# Patient Record
Sex: Female | Born: 1980 | Hispanic: Yes | Marital: Married | Smoking: Former smoker
Health system: Southern US, Community
[De-identification: ages and names within clinical notes are randomized; demographics above are authoritative.]

## PROBLEM LIST (undated history)

## (undated) DIAGNOSIS — E119 Type 2 diabetes mellitus without complications: Secondary | ICD-10-CM

---

## 2017-10-25 ENCOUNTER — Other Ambulatory Visit: Payer: Self-pay | Admitting: Family Medicine

## 2017-10-25 DIAGNOSIS — M25561 Pain in right knee: Principal | ICD-10-CM

## 2017-10-25 DIAGNOSIS — G8929 Other chronic pain: Secondary | ICD-10-CM

## 2017-11-04 ENCOUNTER — Ambulatory Visit
Admission: RE | Admit: 2017-11-04 | Discharge: 2017-11-04 | Disposition: A | Discharge status: Home / Self Care | Payer: BLUE CROSS/BLUE SHIELD | Source: Ambulatory Visit | Attending: Family Medicine | Admitting: Family Medicine

## 2017-11-04 DIAGNOSIS — S83011A Lateral subluxation of right patella, initial encounter: Secondary | ICD-10-CM | POA: Insufficient documentation

## 2017-11-04 DIAGNOSIS — M25561 Pain in right knee: Secondary | ICD-10-CM | POA: Diagnosis present

## 2017-11-04 DIAGNOSIS — X58XXXA Exposure to other specified factors, initial encounter: Secondary | ICD-10-CM | POA: Diagnosis not present

## 2017-11-04 DIAGNOSIS — M25461 Effusion, right knee: Secondary | ICD-10-CM | POA: Insufficient documentation

## 2017-11-04 DIAGNOSIS — R609 Edema, unspecified: Secondary | ICD-10-CM | POA: Diagnosis not present

## 2017-11-04 DIAGNOSIS — G8929 Other chronic pain: Secondary | ICD-10-CM

## 2019-03-24 IMAGING — MR MR KNEE*R* W/O CM
6 series · 37 of 40 positions shown · non-contrast
Comparison: None.

CLINICAL DATA: Right knee pain for the past 8 months after twisting
injury.

EXAM:
MRI OF THE RIGHT KNEE WITHOUT CONTRAST
TECHNIQUE: Multiplanar, multisequence MR imaging of the knee was performed. No
intravenous contrast was administered.

[Series 3: PD fat-sat · axial · 3.0mm · 0.50mm/px · z∈[-74,+50]mm · 8 of 39 slices shown (1 of 4)]
[im 1/39]
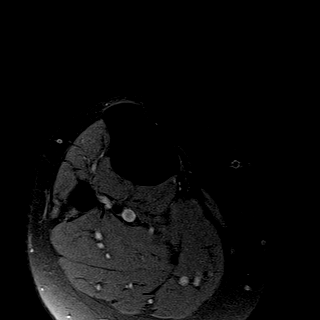
[im 6/39]
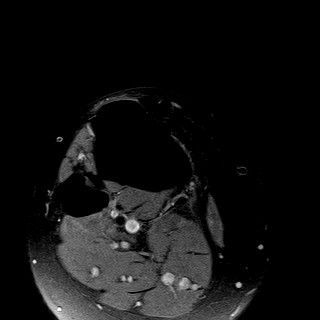
[im 11/39]
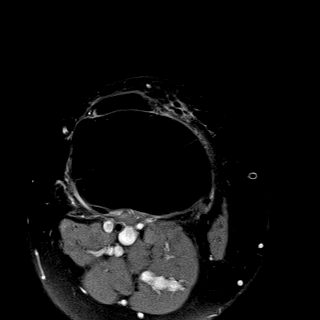
[im 17/39]
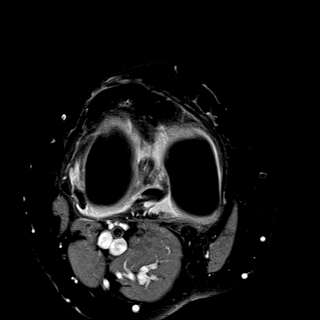
[im 22/39]
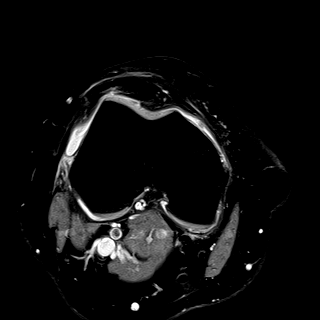
[im 28/39]
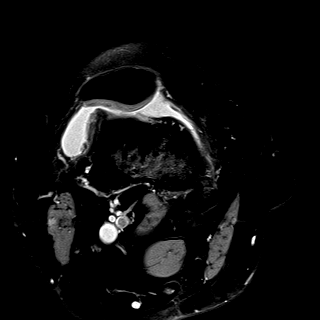
[im 33/39]
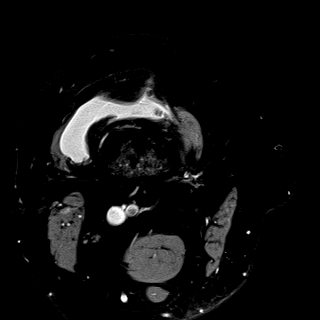
[im 39/39]
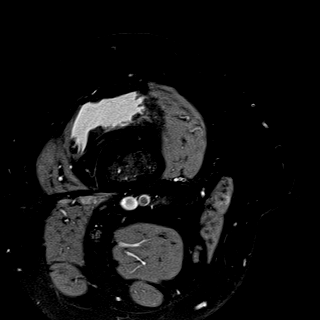

[Series 4: T1 · coronal · 3.0mm · 0.50mm/px · 4 of 30 slices shown]
[im 1/30]
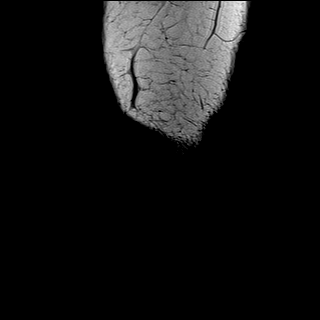
[im 5/30]
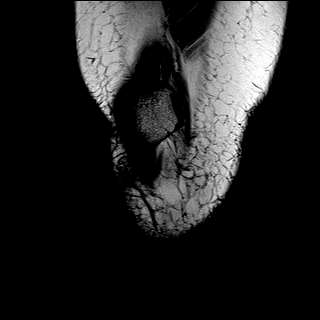
[im 10/30]
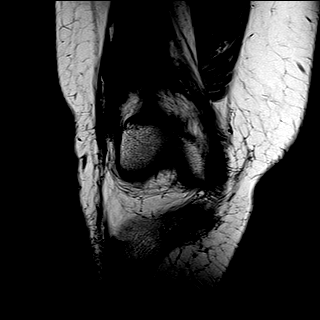
[im 15/30]
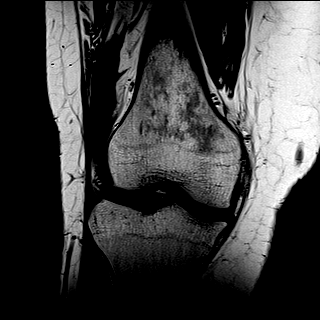

[Series 5: T2 fat-sat · coronal · 3.0mm · 0.31mm/px · 7 of 30 slices shown]
[im 1/30]
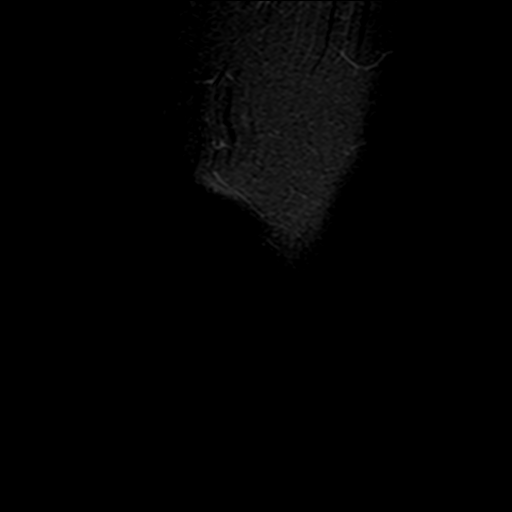
[im 5/30]
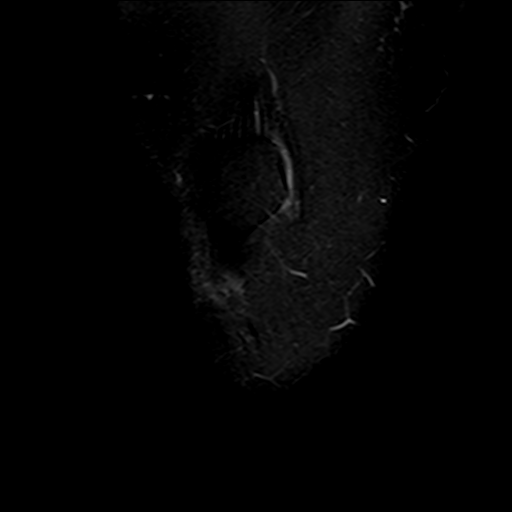
[im 10/30]
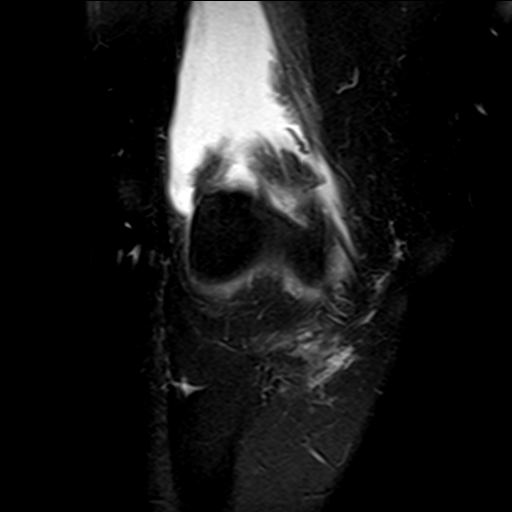
[im 15/30]
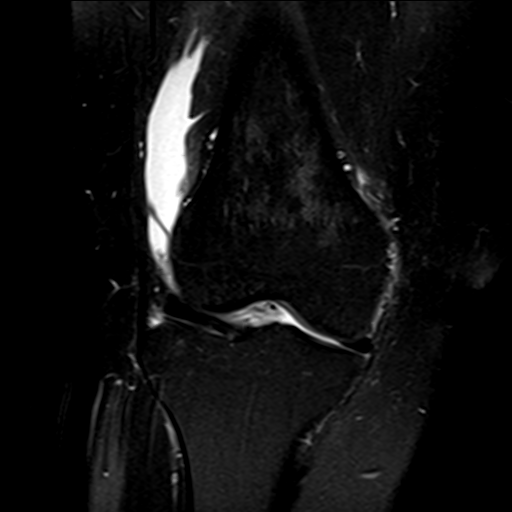
[im 20/30]
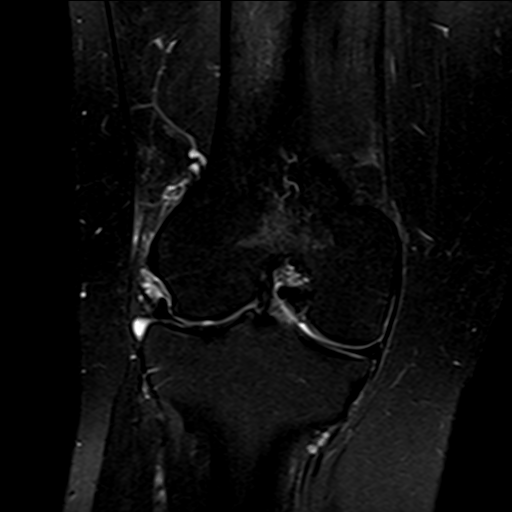
[im 25/30]
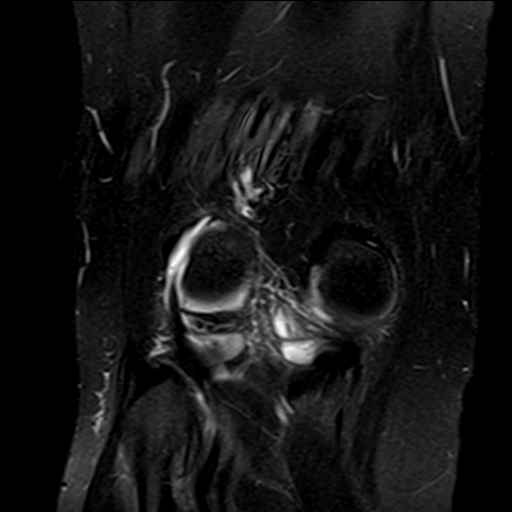
[im 30/30]
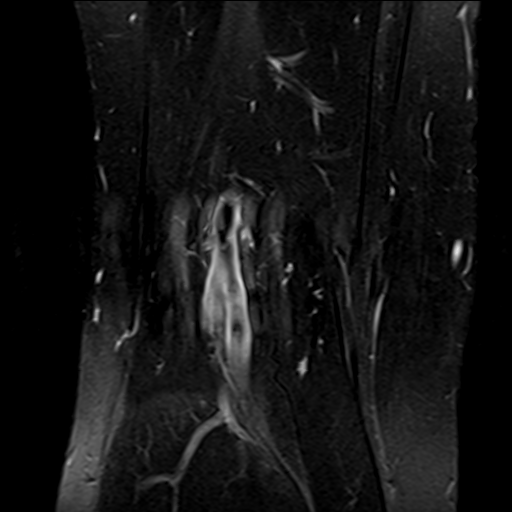

[Series 6: PD fat-sat · coronal · 3.0mm · 0.50mm/px · 7 of 30 slices shown (2 of 4)]
[im 1/30]
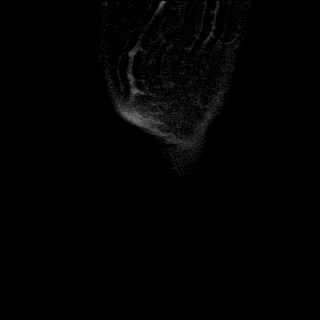
[im 5/30]
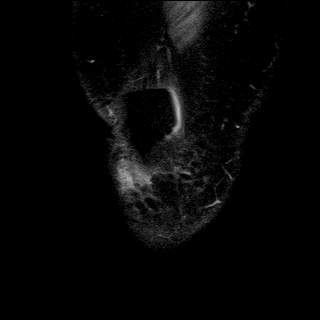
[im 10/30]
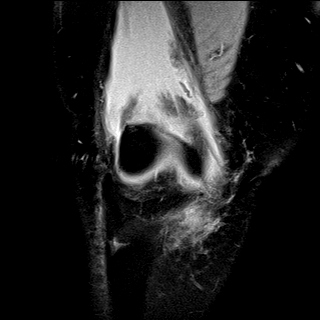
[im 15/30]
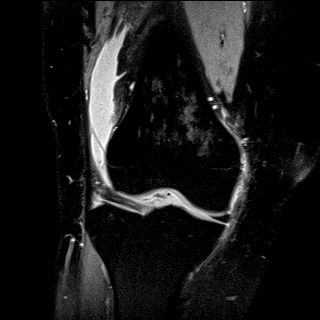
[im 20/30]
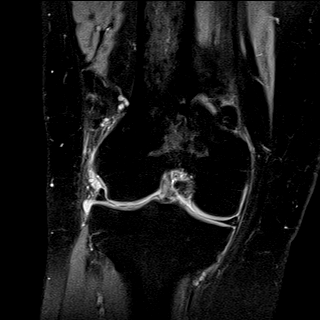
[im 25/30]
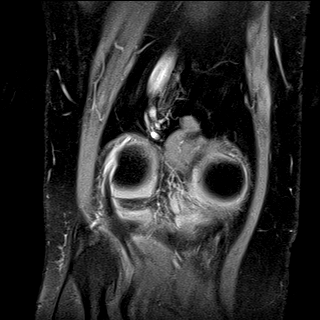
[im 30/30]
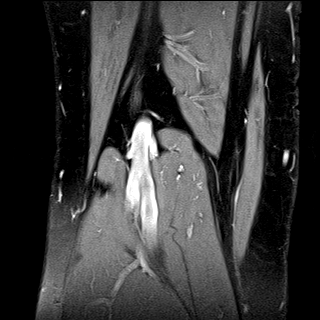

[Series 7: PD fat-sat · sagittal · 3.0mm · 0.50mm/px · 7 of 33 slices shown (3 of 4)]
[im 1/33]
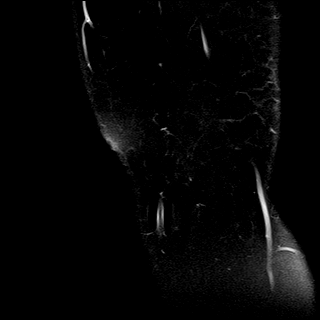
[im 6/33]
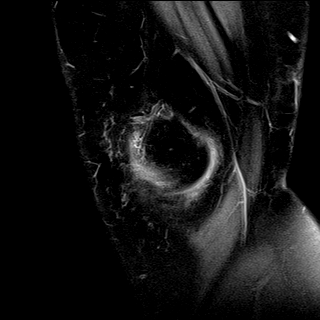
[im 11/33]
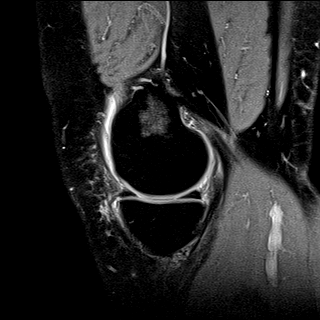
[im 17/33]
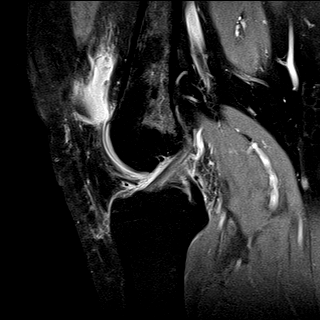
[im 22/33]
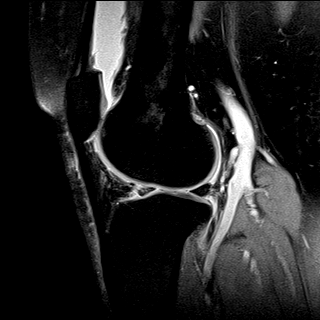
[im 27/33]
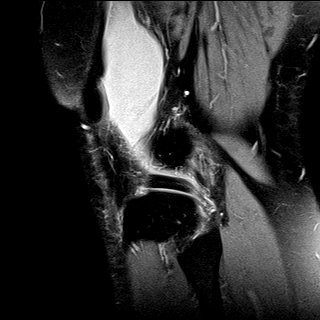
[im 33/33]
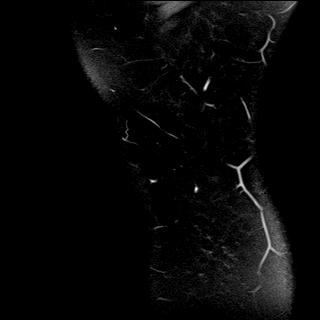

[Series 8: PD fat-sat · oblique · 2.0mm · 0.62mm/px · 4 of 17 slices shown (4 of 4)]
[im 1/17]
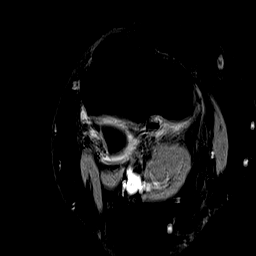
[im 6/17]
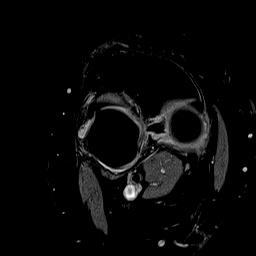
[im 11/17]
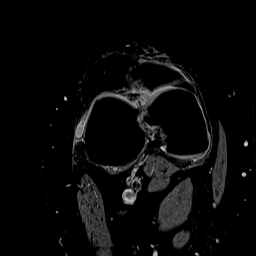
[im 17/17]
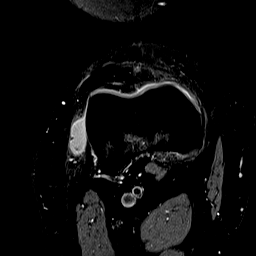

[37 of 40 positions shown; findings below may reference images not displayed]

FINDINGS: MENISCI

Medial meniscus:  Intact.

Lateral meniscus:  Intact.

LIGAMENTS

Cruciates:  Intact ACL and PCL.

Collaterals: Medial collateral ligament is intact. Lateral
collateral ligament complex is intact.

CARTILAGE

Patellofemoral: Mild superficial cartilage irregularity overlying
the lateral patellar facet. No focal defect.

Medial:  Normal.

Lateral: There is a large 6 x 13 mm focal full-thickness defect over
the central weight-bearing lateral femoral condyle.

Joint: Large joint effusion. Edema in the superolateral aspect of
Hoffa's fat. No plical thickening.

Popliteal Fossa:  No Baker cyst. Intact popliteus tendon.

Extensor Mechanism: Intact quadriceps tendon and patellar tendon.
Intact medial and lateral patellar retinaculum. Intact MPFL.

Bones: Lateral subluxation of the patella. No fracture or
dislocation. The tibial tubercle/trochlear groove (TT-TG) distance
is 16 mm.

Other: None.
IMPRESSION: 1. Lateral subluxation of the patella with edema in the
superolateral aspect of Hoffa's fat, suggestive of patellar
maltracking.
2. Large 6 x 13 mm focal full-thickness cartilage defect over the
central weight-bearing lateral femoral condyle.
3. Large joint effusion.

## 2020-09-19 DIAGNOSIS — O24419 Gestational diabetes mellitus in pregnancy, unspecified control: Secondary | ICD-10-CM

## 2020-09-19 HISTORY — DX: Gestational diabetes mellitus in pregnancy, unspecified control: O24.419

## 2020-12-19 ENCOUNTER — Inpatient Hospital Stay: Payer: No Typology Code available for payment source | Admitting: Anesthesiology

## 2020-12-19 ENCOUNTER — Encounter: Payer: Self-pay | Admitting: Obstetrics and Gynecology

## 2020-12-19 ENCOUNTER — Inpatient Hospital Stay
Admission: EM | Admit: 2020-12-19 | Discharge: 2020-12-20 | DRG: 788 | Disposition: A | Discharge status: Home / Self Care | Payer: No Typology Code available for payment source | Attending: Obstetrics and Gynecology | Admitting: Obstetrics and Gynecology

## 2020-12-19 ENCOUNTER — Encounter
Admission: EM | Disposition: A | Discharge status: Home / Self Care | Payer: Self-pay | Source: Home / Self Care | Attending: Obstetrics and Gynecology

## 2020-12-19 ENCOUNTER — Other Ambulatory Visit: Payer: Self-pay

## 2020-12-19 DIAGNOSIS — O4202 Full-term premature rupture of membranes, onset of labor within 24 hours of rupture: Secondary | ICD-10-CM

## 2020-12-19 DIAGNOSIS — O34211 Maternal care for low transverse scar from previous cesarean delivery: Principal | ICD-10-CM

## 2020-12-19 DIAGNOSIS — Z20822 Contact with and (suspected) exposure to covid-19: Secondary | ICD-10-CM | POA: Diagnosis present

## 2020-12-19 DIAGNOSIS — O09523 Supervision of elderly multigravida, third trimester: Secondary | ICD-10-CM | POA: Diagnosis not present

## 2020-12-19 DIAGNOSIS — Z3A37 37 weeks gestation of pregnancy: Secondary | ICD-10-CM

## 2020-12-19 HISTORY — DX: Type 2 diabetes mellitus without complications: E11.9

## 2020-12-19 LAB — COMPREHENSIVE METABOLIC PANEL
ALT: 17 U/L (ref 0–44)
AST: 25 U/L (ref 15–41)
Albumin: 3.1 g/dL — ABNORMAL LOW (ref 3.5–5.0)
Alkaline Phosphatase: 220 U/L — ABNORMAL HIGH (ref 38–126)
Anion gap: 10 (ref 5–15)
BUN: 11 mg/dL (ref 6–20)
CO2: 19 mmol/L — ABNORMAL LOW (ref 22–32)
Calcium: 9 mg/dL (ref 8.9–10.3)
Chloride: 106 mmol/L (ref 98–111)
Creatinine, Ser: 0.55 mg/dL (ref 0.44–1.00)
GFR, Estimated: >60 mL/min (ref >60)
Glucose, Bld: 97 mg/dL (ref 70–99)
Potassium: 3.7 mmol/L (ref 3.5–5.1)
Sodium: 135 mmol/L (ref 135–145)
Total Bilirubin: 0.6 mg/dL (ref 0.3–1.2)
Total Protein: 7 g/dL (ref 6.5–8.1)

## 2020-12-19 LAB — TYPE AND SCREEN
ABO/RH(D): O POS
Antibody Screen: NEGATIVE

## 2020-12-19 LAB — CBC WITH DIFFERENTIAL/PLATELET
Abs Immature Granulocytes: 0.47 10*3/uL — ABNORMAL HIGH (ref 0.00–0.07)
Basophils Absolute: 0.1 10*3/uL (ref 0.0–0.1)
Basophils Relative: 1 %
Eosinophils Absolute: 0.5 10*3/uL (ref 0.0–0.5)
Eosinophils Relative: 3 %
HCT: 39.5 % (ref 36.0–46.0)
Hemoglobin: 12.9 g/dL (ref 12.0–15.0)
Immature Granulocytes: 3 %
Lymphocytes Relative: 18 %
Lymphs Abs: 2.7 10*3/uL (ref 0.7–4.0)
MCH: 27.7 pg (ref 26.0–34.0)
MCHC: 32.7 g/dL (ref 30.0–36.0)
MCV: 84.8 fL (ref 80.0–100.0)
Monocytes Absolute: 1.3 10*3/uL — ABNORMAL HIGH (ref 0.1–1.0)
Monocytes Relative: 9 %
Neutro Abs: 9.9 10*3/uL — ABNORMAL HIGH (ref 1.7–7.7)
Neutrophils Relative %: 66 %
Platelets: 353 10*3/uL (ref 150–400)
RBC: 4.66 MIL/uL (ref 3.87–5.11)
RDW: 15.9 % — ABNORMAL HIGH (ref 11.5–15.5)
WBC: 15 10*3/uL — ABNORMAL HIGH (ref 4.0–10.5)
nRBC: 0 % (ref 0.0–0.2)

## 2020-12-19 LAB — RESP PANEL BY RT-PCR (FLU A&B, COVID) ARPGX2
Influenza A by PCR: NEGATIVE
Influenza B by PCR: NEGATIVE
SARS Coronavirus 2 by RT PCR: NEGATIVE

## 2020-12-19 LAB — HEPATITIS B SURFACE ANTIGEN: Hepatitis B Surface Ag: NONREACTIVE

## 2020-12-19 LAB — HIV ANTIBODY (ROUTINE TESTING W REFLEX): HIV Screen 4th Generation wRfx: NONREACTIVE

## 2020-12-19 LAB — GLUCOSE, CAPILLARY: Glucose-Capillary: 128 mg/dL — ABNORMAL HIGH (ref 70–99)

## 2020-12-19 SURGERY — Surgical Case
Anesthesia: Spinal

## 2020-12-19 MED ORDER — ONDANSETRON HCL 4 MG/2ML IJ SOLN
INTRAMUSCULAR | Status: DC | PRN
  Administered 2020-12-19: 4 mg via INTRAVENOUS

## 2020-12-19 MED ORDER — DEXAMETHASONE SODIUM PHOSPHATE 10 MG/ML IJ SOLN
INTRAMUSCULAR | Status: AC
  Filled 2020-12-19: qty 1

## 2020-12-19 MED ORDER — TETRACAINE HCL 1 % IJ SOLN
INTRAMUSCULAR | Status: DC | PRN
  Administered 2020-12-19: 1 mg via INTRASPINAL

## 2020-12-19 MED ORDER — OXYTOCIN-SODIUM CHLORIDE 30-0.9 UT/500ML-% IV SOLN
2.5000 [IU]/h | INTRAVENOUS | Status: AC
  Filled 2020-12-19: qty 500

## 2020-12-19 MED ORDER — LIDOCAINE 5 % EX PTCH
MEDICATED_PATCH | CUTANEOUS | Status: DC | PRN
  Administered 2020-12-19: 1 via TRANSDERMAL

## 2020-12-19 MED ORDER — NALBUPHINE HCL 10 MG/ML IJ SOLN: 2.5000 mg | Freq: Four times a day (QID) | INTRAMUSCULAR | Status: DC | PRN

## 2020-12-19 MED ORDER — SENNOSIDES-DOCUSATE SODIUM 8.6-50 MG PO TABS
2.0000 | ORAL_TABLET | ORAL | Status: DC
  Administered 2020-12-20: 2 via ORAL
  Filled 2020-12-19 (×2): qty 2

## 2020-12-19 MED ORDER — CEFAZOLIN SODIUM-DEXTROSE 2-4 GM/100ML-% IV SOLN
2.0000 g | INTRAVENOUS | Status: AC
  Administered 2020-12-19: 2 g via INTRAVENOUS

## 2020-12-19 MED ORDER — BUPIVACAINE IN DEXTROSE 0.75-8.25 % IT SOLN
INTRATHECAL | Status: DC | PRN
  Administered 2020-12-19: 1.5 mL via INTRATHECAL

## 2020-12-19 MED ORDER — MORPHINE SULFATE (PF) 0.5 MG/ML IJ SOLN
INTRAMUSCULAR | Status: DC | PRN
  Administered 2020-12-19: .1 mg via INTRATHECAL

## 2020-12-19 MED ORDER — PHENYLEPHRINE HCL (PRESSORS) 10 MG/ML IV SOLN
INTRAVENOUS | Status: DC | PRN
  Administered 2020-12-19 (×5): 100 ug via INTRAVENOUS

## 2020-12-19 MED ORDER — OXYCODONE-ACETAMINOPHEN 5-325 MG PO TABS
1.0000 | ORAL_TABLET | ORAL | Status: DC | PRN
  Administered 2020-12-20 (×2): 1 via ORAL
  Filled 2020-12-19 (×2): qty 1

## 2020-12-19 MED ORDER — IBUPROFEN 800 MG PO TABS
800.0000 mg | ORAL_TABLET | Freq: Three times a day (TID) | ORAL | Status: DC
  Administered 2020-12-19 – 2020-12-20 (×4): 800 mg via ORAL
  Filled 2020-12-19 (×4): qty 1

## 2020-12-19 MED ORDER — LACTATED RINGERS IV SOLN: INTRAVENOUS | Status: DC | PRN

## 2020-12-19 MED ORDER — ONDANSETRON HCL 4 MG/2ML IJ SOLN: 4.0000 mg | Freq: Once | INTRAMUSCULAR | Status: DC | PRN

## 2020-12-19 MED ORDER — SIMETHICONE 80 MG PO CHEW
80.0000 mg | CHEWABLE_TABLET | Freq: Four times a day (QID) | ORAL | Status: DC
  Administered 2020-12-19 – 2020-12-20 (×6): 80 mg via ORAL
  Filled 2020-12-19 (×6): qty 1

## 2020-12-19 MED ORDER — LACTATED RINGERS IV SOLN: INTRAVENOUS | Status: DC

## 2020-12-19 MED ORDER — ONDANSETRON HCL 4 MG/2ML IJ SOLN
INTRAMUSCULAR | Status: AC
  Filled 2020-12-19: qty 2

## 2020-12-19 MED ORDER — MENTHOL 3 MG MT LOZG
1.0000 | LOZENGE | OROMUCOSAL | Status: DC | PRN
  Filled 2020-12-19: qty 9

## 2020-12-19 MED ORDER — SOD CITRATE-CITRIC ACID 500-334 MG/5ML PO SOLN
ORAL | Status: AC
  Administered 2020-12-19: 30 mL via ORAL
  Filled 2020-12-19: qty 15

## 2020-12-19 MED ORDER — MORPHINE SULFATE (PF) 0.5 MG/ML IJ SOLN
INTRAMUSCULAR | Status: AC
  Filled 2020-12-19: qty 10

## 2020-12-19 MED ORDER — ZOLPIDEM TARTRATE 5 MG PO TABS
5.0000 mg | ORAL_TABLET | Freq: Every evening | ORAL | Status: DC | PRN
Start: 2020-12-19 — End: 2020-12-21

## 2020-12-19 MED ORDER — FENTANYL CITRATE (PF) 100 MCG/2ML IJ SOLN
INTRAMUSCULAR | Status: DC | PRN
  Administered 2020-12-19: 15 ug via INTRAVENOUS

## 2020-12-19 MED ORDER — MORPHINE SULFATE (PF) 2 MG/ML IV SOLN: 1.0000 mg | INTRAVENOUS | Status: AC | PRN

## 2020-12-19 MED ORDER — OXYTOCIN-SODIUM CHLORIDE 30-0.9 UT/500ML-% IV SOLN
INTRAVENOUS | Status: DC | PRN
  Administered 2020-12-19: 500 mL via INTRAVENOUS

## 2020-12-19 MED ORDER — SODIUM CHLORIDE 0.9 % IV SOLN
INTRAVENOUS | Status: DC | PRN
  Administered 2020-12-19: 50 ug/min via INTRAVENOUS

## 2020-12-19 MED ORDER — PRENATAL MULTIVITAMIN CH
1.0000 | ORAL_TABLET | Freq: Every day | ORAL | Status: DC
  Administered 2020-12-19 – 2020-12-20 (×2): 1 via ORAL
  Filled 2020-12-19 (×2): qty 1

## 2020-12-19 MED ORDER — DIPHENHYDRAMINE HCL 25 MG PO CAPS: 25.0000 mg | ORAL_CAPSULE | Freq: Four times a day (QID) | ORAL | Status: DC | PRN

## 2020-12-19 MED ORDER — FENTANYL CITRATE (PF) 100 MCG/2ML IJ SOLN: 25.0000 ug | INTRAMUSCULAR | Status: DC | PRN

## 2020-12-19 MED ORDER — DEXAMETHASONE SODIUM PHOSPHATE 10 MG/ML IJ SOLN
INTRAMUSCULAR | Status: DC | PRN
  Administered 2020-12-19: 10 mg via INTRAVENOUS

## 2020-12-19 MED ORDER — LIDOCAINE 5 % EX PTCH
MEDICATED_PATCH | CUTANEOUS | Status: AC
  Filled 2020-12-19: qty 1

## 2020-12-19 MED ORDER — CEFAZOLIN SODIUM-DEXTROSE 2-4 GM/100ML-% IV SOLN
INTRAVENOUS | Status: AC
  Filled 2020-12-19: qty 100

## 2020-12-19 MED ORDER — FENTANYL CITRATE (PF) 100 MCG/2ML IJ SOLN
INTRAMUSCULAR | Status: AC
  Filled 2020-12-19: qty 2

## 2020-12-19 MED ORDER — OXYTOCIN-SODIUM CHLORIDE 30-0.9 UT/500ML-% IV SOLN
INTRAVENOUS | Status: AC
  Filled 2020-12-19: qty 1000

## 2020-12-19 MED ORDER — KETOROLAC TROMETHAMINE 15 MG/ML IJ SOLN
15.0000 mg | Freq: Four times a day (QID) | INTRAMUSCULAR | Status: DC | PRN
  Administered 2020-12-19: 15 mg via INTRAVENOUS
  Filled 2020-12-19 (×2): qty 1

## 2020-12-19 MED ORDER — SOD CITRATE-CITRIC ACID 500-334 MG/5ML PO SOLN: 30.0000 mL | ORAL | Status: AC

## 2020-12-19 SURGICAL SUPPLY — 27 items
ADHESIVE MASTISOL STRL (MISCELLANEOUS) ×2 IMPLANT
BAG COUNTER SPONGE EZ (MISCELLANEOUS) ×2 IMPLANT
CANISTER SUCT 3000ML PPV (MISCELLANEOUS) ×2 IMPLANT
CHLORAPREP W/TINT 26 (MISCELLANEOUS) ×4 IMPLANT
COVER WAND RF STERILE (DRAPES) ×2 IMPLANT
DRESSING TELFA ISLAND 4X8 (GAUZE/BANDAGES/DRESSINGS) ×2 IMPLANT
DRSG TELFA 3X8 NADH (GAUZE/BANDAGES/DRESSINGS) ×2 IMPLANT
GAUZE SPONGE 4X4 12PLY STRL (GAUZE/BANDAGES/DRESSINGS) ×2 IMPLANT
GLOVE BIOGEL PI ORTHO PRO 7.5 (GLOVE) ×1
GLOVE PI ORTHO PRO STRL 7.5 (GLOVE) ×1 IMPLANT
GOWN STRL REUS W/ TWL LRG LVL3 (GOWN DISPOSABLE) ×2 IMPLANT
GOWN STRL REUS W/TWL LRG LVL3 (GOWN DISPOSABLE) ×2
KIT TURNOVER KIT A (KITS) ×2 IMPLANT
MANIFOLD NEPTUNE II (INSTRUMENTS) ×2 IMPLANT
MAT PREVALON FULL STRYKER (MISCELLANEOUS) ×2 IMPLANT
NS IRRIG 1000ML POUR BTL (IV SOLUTION) ×2 IMPLANT
PACK C SECTION AR (MISCELLANEOUS) ×2 IMPLANT
PAD OB MATERNITY 4.3X12.25 (PERSONAL CARE ITEMS) ×2 IMPLANT
PAD PREP 24X41 OB/GYN DISP (PERSONAL CARE ITEMS) ×2 IMPLANT
PENCIL SMOKE EVACUATOR (MISCELLANEOUS) ×2 IMPLANT
RETRACTOR WND ALEXIS-O 25 LRG (MISCELLANEOUS) ×1 IMPLANT
RTRCTR WOUND ALEXIS O 25CM LRG (MISCELLANEOUS) ×2
SPONGE LAP 18X18 RF (DISPOSABLE) ×2 IMPLANT
SUT VIC AB 0 CTX 36 (SUTURE) ×2
SUT VIC AB 0 CTX36XBRD ANBCTRL (SUTURE) ×2 IMPLANT
SUT VIC AB 1 CT1 36 (SUTURE) ×4 IMPLANT
SUT VICRYL+ 3-0 36IN CT-1 (SUTURE) ×4 IMPLANT

## 2020-12-19 NOTE — H&P (Addendum)
History and Physical   HPI  Kristina Sullivan is a 40 y.o. P3A2505 at [redacted]w[redacted]d Estimated Date of Delivery: 01/04/21 who is being admitted for C-section. H/O previous CD X2. She is grossly ruptured and laboring and requesting repeat CD.  Very limited prenatal care during this pregnancy. Records not available.   OB History  OB History  Gravida Para Term Preterm AB Living  4 1 1  0 1 2  SAB IAB Ectopic Multiple Live Births  0 0 0 0 0    # Outcome Date GA Lbr Len/2nd Weight Sex Delivery Anes PTL Lv  4 Current           3 Gravida           2 AB           1 Term             PROBLEM LIST  Pregnancy complications or risks: Patient Active Problem List   Diagnosis Date Noted  . Normal labor 12/19/2020      Past Medical History:  Diagnosis Date  . Diabetes mellitus without complication (HCC)      Medications    Current Discharge Medication List    CONTINUE these medications which have NOT CHANGED   Details  Prenatal Vit-Fe Fumarate-FA (PRENATAL MULTIVITAMIN) TABS tablet Take 1 tablet by mouth daily at 12 noon.         Allergies  Patient has no known allergies.  Review of Systems  Pertinent items are noted in HPI.  Physical Exam  BP 137/89   Pulse 81   Temp 98.2 F (36.8 C) (Oral)   Lungs:  CTA B Cardio: RRR without M/R/G Abd: Soft, gravid, NT Presentation: cephalic EXT: No C/C/ 1+ Edema DTRs: 2+ B CERVIX: Dilation: 2.5 Effacement (%): 100 Cervical Position: Middle Station: 0 Presentation: Vertex Exam by:: D. MEans, RN     FHR:  Variability: Good {> 6 bpm)  Toco: Uterine Contractions: Q 2 min  Test Results  Results for orders placed or performed during the hospital encounter of 12/19/20 (from the past 24 hour(s))  CBC with Differential/Platelet     Status: Abnormal   Collection Time: 12/19/20  3:08 AM  Result Value Ref Range   WBC 15.0 (H) 4.0 - 10.5 K/uL   RBC 4.66 3.87 - 5.11 MIL/uL   Hemoglobin 12.9 12.0 - 15.0 g/dL   HCT  02/18/21 39.7 - 67.3 %   MCV 84.8 80.0 - 100.0 fL   MCH 27.7 26.0 - 34.0 pg   MCHC 32.7 30.0 - 36.0 g/dL   RDW 41.9 (H) 37.9 - 02.4 %   Platelets 353 150 - 400 K/uL   nRBC 0.0 0.0 - 0.2 %   Neutrophils Relative % 66 %   Neutro Abs 9.9 (H) 1.7 - 7.7 K/uL   Lymphocytes Relative 18 %   Lymphs Abs 2.7 0.7 - 4.0 K/uL   Monocytes Relative 9 %   Monocytes Absolute 1.3 (H) 0.1 - 1.0 K/uL   Eosinophils Relative 3 %   Eosinophils Absolute 0.5 0.0 - 0.5 K/uL   Basophils Relative 1 %   Basophils Absolute 0.1 0.0 - 0.1 K/uL   Immature Granulocytes 3 %   Abs Immature Granulocytes 0.47 (H) 0.00 - 0.07 K/uL  Comprehensive metabolic panel     Status: Abnormal   Collection Time: 12/19/20  3:08 AM  Result Value Ref Range   Sodium 135 135 - 145 mmol/L   Potassium 3.7 3.5 -  5.1 mmol/L   Chloride 106 98 - 111 mmol/L   CO2 19 (L) 22 - 32 mmol/L   Glucose, Bld 97 70 - 99 mg/dL   BUN 11 6 - 20 mg/dL   Creatinine, Ser 1.61 0.44 - 1.00 mg/dL   Calcium 9.0 8.9 - 09.6 mg/dL   Total Protein 7.0 6.5 - 8.1 g/dL   Albumin 3.1 (L) 3.5 - 5.0 g/dL   AST 25 15 - 41 U/L   ALT 17 0 - 44 U/L   Alkaline Phosphatase 220 (H) 38 - 126 U/L   Total Bilirubin 0.6 0.3 - 1.2 mg/dL   GFR, Estimated >04 >54 mL/min   Anion gap 10 5 - 15     Assessment   G5P2012 at [redacted]w[redacted]d Estimated Date of Delivery: 01/04/21  The fetus is reassuring.   Patient Active Problem List   Diagnosis Date Noted  . Normal labor 12/19/2020    Plan  1. Admit to L&D :   2. EFM: -- Category 1 3. OB labs 4. Admission labs  5. Plan repeat CD  Elonda Husky, M.D. 12/19/2020 3:46 AM

## 2020-12-19 NOTE — Transfer of Care (Signed)
Immediate Anesthesia Transfer of Care Note  Patient: Kristina Sullivan  Procedure(s) Performed: CESAREAN SECTION  Patient Location: Mother/Baby  Anesthesia Type:Spinal  Level of Consciousness: awake, alert  and oriented  Airway & Oxygen Therapy: Patient Spontanous Breathing  Post-op Assessment: Report given to RN and Post -op Vital signs reviewed and stable  Post vital signs: Reviewed  Last Vitals:  Vitals Value Taken Time  BP 108/58 12/19/20 0633  Temp 36.9 C 12/19/20 0633  Pulse 78 12/19/20 0633  Resp 18 12/19/20 0633  SpO2 98 % 12/19/20 0633    Last Pain:  Vitals:   12/19/20 0241  TempSrc: Oral         Complications: No complications documented.

## 2020-12-19 NOTE — OB Triage Note (Signed)
Pt arrived to unit wheeled by ED staff for evaluation of LOF. Patient is [redacted]w[redacted]d (by care everywhere [facility] records). Patient has history of GDM type 2 (diet controlled), AMA, Previous C-section x. Patient reports she felt her water breat around 1am and has been having worsening contractions since then. Pt placed on EFM and TOCO to non tender area of abdomen. Patient noted to be grossly ruptured with clear fluid with membranes coming from vagina. Patient history reviewed and triaged for r/o labor and need for imminent c-section. Patient is at [facility] and was told by insurance that [facility] accepts her insurance. Will notify MD on call for service of patient's arrival.

## 2020-12-19 NOTE — Anesthesia Preprocedure Evaluation (Signed)
Anesthesia Evaluation  Patient identified by MRN, date of birth, ID band Patient awake    Reviewed: Allergy & Precautions, NPO status , Patient's Chart, lab work & pertinent test results  Airway Mallampati: III  TM Distance: >3 FB     Dental   Pulmonary neg pulmonary ROS,    Pulmonary exam normal        Cardiovascular negative cardio ROS Normal cardiovascular exam     Neuro/Psych negative neurological ROS  negative psych ROS   GI/Hepatic negative GI ROS, Neg liver ROS,   Endo/Other  diabetes  Renal/GU negative Renal ROS  negative genitourinary   Musculoskeletal negative musculoskeletal ROS (+)   Abdominal (+) + obese,   Peds negative pediatric ROS (+)  Hematology negative hematology ROS (+)   Anesthesia Other Findings Past Medical History: No date: Diabetes mellitus without complication (HCC)  Reproductive/Obstetrics (+) Pregnancy                             Anesthesia Physical Anesthesia Plan  ASA: II and emergent  Anesthesia Plan: Spinal   Post-op Pain Management:    Induction: Intravenous  PONV Risk Score and Plan:   Airway Management Planned: Nasal Cannula  Additional Equipment:   Intra-op Plan:   Post-operative Plan:   Informed Consent: I have reviewed the patients History and Physical, chart, labs and discussed the procedure including the risks, benefits and alternatives for the proposed anesthesia with the patient or authorized representative who has indicated his/her understanding and acceptance.     Dental advisory given  Plan Discussed with: CRNA and Surgeon  Anesthesia Plan Comments:         Anesthesia Quick Evaluation

## 2020-12-19 NOTE — Op Note (Signed)
      OP NOTE  Date: 12/19/2020   6:21 AM Name Kristina Sullivan MR# [id number]  Preoperative Diagnosis: 1. Intrauterine pregnancy at [redacted]w[redacted]d Active Problems:   Normal labor  2.  Previous CD X2 3.  Labor 4. SROM  Postoperative Diagnosis: 1. Intrauterine pregnancy at [redacted]w[redacted]d, delivered 2. Viable infant 3. Remainder same as pre-op   Procedure: 1. Repeat Low-Transverse Cesarean Section  (Verticle skin incision)  Surgeon: Elonda Husky, MD  Assistant:  Janee Morn CNM  Anesthesia: Spinal    EBL: 650 ml     Findings: 1) female infant, Apgar scores of 9   at 1 minute and 9   at 5 minutes and a birthweight of 111.82  ounces.    2) Normal uterus, tubes and ovaries.    Procedure:  The patient was prepped and draped in the supine position and placed under spinal anesthesia.  A verticle incision was made.  The large wide old scar was systematically removed with sharp dissection.  We carried the dissection down to the level of the fascia.  The fascia was incised in the midline.  The fascia was then elevated from the rectus muscles with sharp dissection exposing the peritoneum.  The peritoneum was carefully entered with care being taken to avoid bowel and bladder.  A self-retaining retractor was placed.  The visceral peritoneum was incised in a curvilinear fashion across the lower uterine segment creating a bladder flap. A transverse incision was made across the lower uterine segment and extended laterally and superiorly using the bandage scissors.  Artificial rupture membranes was performed and Clear fluid was noted.  The infant was delivered from the cephalic position.  A nuchal cord was not present. After an appropriate time interval, the cord was doubly clamped and cut. Cord blood was obtained if required.  The infant was handed to the pediatric personnel  who then placed the infant under heat lamps where it was cleaned dried and suctioned as needed. The placenta was delivered. The  hysterotomy incision was then identified on ring forceps.  The uterine cavity was cleaned with a moist lap sponge.  The hysterotomy incision was closed with a running interlocking suture of Vicryl.  Hemostasis was excellent.  Pitocin was run in the IV and the uterus was found to be firm. The posterior cul-de-sac and gutters were cleaned and inspected.  Hemostasis was noted.  The fascia was then closed with a running suture of #1 Vicryl.  Hemostasis of the subcutaneous tissues was obtained using the Bovie.  The subcutaneous tissues were closed with a running suture of 000 Vicryl as well as several interupted sutures.  The skin was undermined to allow a midline closure.  Several interupted sutures were place followed by skn staples. A honeycomb dressing was placed. The patient went to the recovery room in stable condition.   Elonda Husky, M.D. 12/19/2020 6:21 AM

## 2020-12-19 NOTE — OB Triage Note (Signed)
Dr. Logan Bores notified of patients arrival and cervical exam status. MD plans to come in for section. Telephone orders given to begin prepping patient for c-section, notify assisting scrub person, notify anesthesia, and draw applicable labs. Will begin to pre-op patient.

## 2020-12-19 NOTE — Progress Notes (Signed)
Dr. Noralyn Pick and Mathews Argyle, CRNA from Anesthesia notified of need for c-section. Will call when MD is ready and on unit.

## 2020-12-19 NOTE — Anesthesia Procedure Notes (Signed)
Spinal  Patient location during procedure: OR Reason for block: surgical anesthesia Staffing Performed: resident/CRNA  Anesthesiologist: Alvin Critchley, MD Resident/CRNA: Rolla Plate, CRNA Preanesthetic Checklist Completed: patient identified, IV checked, site marked, risks and benefits discussed, surgical consent, monitors and equipment checked, pre-op evaluation and timeout performed Spinal Block Patient position: sitting Prep: ChloraPrep and site prepped and draped Patient monitoring: heart rate, continuous pulse ox, blood pressure and cardiac monitor Approach: midline Location: L4-5 Injection technique: single-shot Needle Needle type: Whitacre and Introducer  Needle gauge: 24 G Needle length: 9 cm Assessment Sensory level: T4 Events: CSF return Additional Notes Negative paresthesia. Negative blood return. Positive free-flowing CSF. Expiration date of kit checked and confirmed. Patient tolerated procedure well, without complications.

## 2020-12-20 DIAGNOSIS — O4202 Full-term premature rupture of membranes, onset of labor within 24 hours of rupture: Secondary | ICD-10-CM

## 2020-12-20 DIAGNOSIS — O09523 Supervision of elderly multigravida, third trimester: Secondary | ICD-10-CM

## 2020-12-20 DIAGNOSIS — Z3A37 37 weeks gestation of pregnancy: Secondary | ICD-10-CM

## 2020-12-20 DIAGNOSIS — O34211 Maternal care for low transverse scar from previous cesarean delivery: Secondary | ICD-10-CM

## 2020-12-20 LAB — RPR: RPR Ser Ql: NONREACTIVE

## 2020-12-20 LAB — ABO/RH: ABO/RH(D): O POS

## 2020-12-20 MED ORDER — IBUPROFEN 800 MG PO TABS: 800.0000 mg | ORAL_TABLET | Freq: Three times a day (TID) | ORAL | 0 refills | Status: DC

## 2020-12-20 MED ORDER — OXYCODONE-ACETAMINOPHEN 5-325 MG PO TABS: 1.0000 | ORAL_TABLET | ORAL | Status: DC | PRN

## 2020-12-20 MED ORDER — HYDROCODONE-ACETAMINOPHEN 5-325 MG PO TABS: 1.0000 | ORAL_TABLET | Freq: Four times a day (QID) | ORAL | 0 refills | Status: DC | PRN

## 2020-12-20 NOTE — Anesthesia Postprocedure Evaluation (Signed)
Anesthesia Post Note  Patient: Kristina Sullivan  Procedure(s) Performed: CESAREAN SECTION  Patient location during evaluation: Mother Baby Anesthesia Type: Spinal Level of consciousness: awake and alert and oriented Pain management: pain level controlled Vital Signs Assessment: post-procedure vital signs reviewed and stable Respiratory status: spontaneous breathing Cardiovascular status: blood pressure returned to baseline Postop Assessment: no headache and no backache Anesthetic complications: no   No complications documented.   Last Vitals:  Vitals:   12/20/20 0300 12/20/20 0734  BP:  100/71  Pulse: 93 76  Resp:  18  Temp:  36.8 C  SpO2: 96% 99%    Last Pain:  Vitals:   12/20/20 0828  TempSrc:   PainSc: 3                  [name]

## 2020-12-20 NOTE — Anesthesia Post-op Follow-up Note (Signed)
  Anesthesia Pain Follow-up Note  Patient: Kristina Sullivan  Day #: 1  Date of Follow-up: 12/20/2020 Time: 10:27 AM  Last Vitals:  Vitals:   12/20/20 0300 12/20/20 0734  BP:  100/71  Pulse: 93 76  Resp:  18  Temp:  36.8 C  SpO2: 96% 99%    Level of Consciousness: alert  Pain: none   Side Effects:None  Catheter Site Exam:clean, dry, no drainage     Plan: D/C from anesthesia care at surgeon's request  Zayna Toste

## 2020-12-20 NOTE — Clinical Social Work Maternal (Signed)
CLINICAL SOCIAL WORK MATERNAL/CHILD NOTE  Patient Details  Name: Kristina Sullivan MRN: [id number] Date of Birth: 11-Mar-1981   Date:  12/20/2020  Clinical Social Worker Initiating Note:    Date/Time: Initiated:  12/20/20/1506     Child's Name:  Kristina Sullivan   Biological Parents:  Mother,Father   Need for Interpreter:  None   Reason for Referral:  Late or No Prenatal Care    Address:  [address] [place] [address]    Phone number:  [phone] (home) [phone] (work)    Additional phone number: N/A  Household Members/Support Persons (HM/SP):   Household Member/Support Person 1,Household Member/Support Person 2   HM/SP Name Relationship DOB or Age  HM/SP -1 Kristina Sullivan FOB 40 years old  HM/SP -2 Kristina Sullivan Son 99 years old  HM/SP -3        HM/SP -4        HM/SP -5        HM/SP -6        HM/SP -7        HM/SP -8          Natural Supports (not living in the home):  Spouse/significant other,Children   Professional Supports:     Employment: Environmental education officer   Type of Work: Advertising account planner   Education:  9 to 11 years   Homebound arranged: No  Surveyor, quantity Resources:  Chief of Staff)   Other Resources:      Cultural/Religious Considerations Which May Impact Care:  None discussed  Strengths:  Ability to meet basic needs ,Home prepared for child    Psychotropic Medications:         Pediatrician:       Pediatrician List:   [facility] [place]    [place]    [facility]      Pediatrician Fax Number:    Risk Factors/Current Problems:      Cognitive State:      Mood/Affect:      CSW Assessment: CSW received a consult for limited prenatal care. MOB reports she received her prenatal care with [facility]. MOB reports at a time her insurance was not covering her visits and did stop going to appointments, as often.  Appointment dates listed in care everywhere.  Attempted to meet with patient at bedside x3, but patient in shower/in bathroom/with nursing. CSW spoke with patient via phone. Explained CSW's role and reason for referral.   MOB reported she is feeling Ok post delivery. MOB was alert and responsive during assessment.   Confirmed contact information for MOB. MOB and Baby will be living with FOB Rogelia Boga) and son Dianah Field, age 75) at discharge.     MOB reported she does not receive any services at this time. MOB will apply for [facility].  MOB reports she has not decided on a pediatrician for baby, but will do before baby is discharged.   MOB reported she has items needed for Baby. MOB reported she has reliable transportation for herself and Baby.     MOB reported she has no history of mental health issues or substance use issues. MOB reported overall she has a good support system and is coping well emotionally at this time. MOB denied SI, HI, or DV.    CSW provided education and information sheets on PPD and SIDS. MOB verbalized understanding. CSW encouraged MOB to reach out to her  Provider with any questions or needs for support or resources, even after discharge.    MOB denied any needs or questions at this time. CSW encouraged MOB to reach out if any arise prior to discharge.    CSW update RN Kristina Sullivan prior to meeting with MOB. Per RN, MOB is appropriate and attentive to Baby. Please re consult CSW if any additional needs or concerns arise.  CSW Plan/Description:  No Further Intervention Required/No Barriers to Discharge,Sudden Infant Death Syndrome (SIDS) Education,Other Information/Referral to [employer], [place] 12/20/2020, 3:18 PM

## 2020-12-20 NOTE — Discharge Instructions (Signed)
Cuidados en el postparto luego de un parto por cesrea Postpartum Care After Cesarean Delivery Lea esta informacin sobre cmo cuidarse desde el momento en que nazca su beb y Kristina Sullivan 6 a 12 semanas despus del parto (perodo del posparto). El mdico tambin podr darle indicaciones ms especficas. Comunquese con el mdico si tiene problemas o preguntas. Siga estas indicaciones en su casa: Medicamentos  [employer] de venta libre y los recetados solamente como se lo haya indicado el mdico.  Si le recetaron un antibitico, tmelo como se lo haya indicado el mdico. No deje de tomar el antibitico aunque comience a sentirse mejor.  Pregntele al mdico si el medicamento recetado: ? Hace que sea necesario que evite conducir o usar maquinaria pesada. ? Puede causarle estreimiento. Es posible que deba tomar medidas para prevenir o tratar el estreimiento, por ejemplo:  Beber suficiente lquido como para Pharmacologist la orina de color amarillo plido.  Tomar medicamentos recetados o de [employer].  Consumir alimentos ricos en fibra, como frijoles, cereales integrales, y frutas y verduras frescas.  Limitar su consumo de alimentos ricos en grasa y azcares procesados, como los alimentos fritos o dulces. Actividad  Retome sus actividades normales de a poco segn lo indicado por el mdico.  Evite las actividades que demandan mucho esfuerzo y Engineer, drilling (que son extenuantes) hasta que el mdico se lo autorice. Siempre es ms seguro caminar a un ritmo tranquilo a moderado. Pregntele al mdico qu actividades son seguras para usted. ? No levante objetos que pesen ms que su beb o ms de 10 libras (4,5 kg), como se lo haya indicado el mdico. ? No pase la aspiradora, suba escaleras ni conduzca un vehculo durante el tiempo que le indique el mdico.  De ser posible, pdale a alguien que le brinde ayuda con las tareas domsticas hasta que pueda realizar sus actividades habituales por su  cuenta.  Descanse todo lo que pueda. Trate de descansar o tomar una siesta mientras el beb duerme. Hemorragia vaginal  Es normal tener un poco de hemorragia vaginal (loquios) despus del parto. Use un apsito sanitario para absorber el sangrado vaginal y la secrecin. ? Durante la primera semana despus del parto, la cantidad y el aspecto de los loquios a menudo es similar a las del perodo menstrual. ? Durante las siguientes semanas disminuir gradualmente hasta convertirse en una secrecin seca amarronada o [place]. ? En la [employer], los loquios se detienen [employer] Life [employer] 4 a 6semanas despus del [place]. Los sangrados vaginales pueden variar de mujer a Nurse, learning disability.  Cambie los apsitos sanitarios con frecuencia. Observe si hay cambios en el flujo, como: ? Aumento repentino en el volumen. ? Cambio en el color. ? Cogulos sanguneos grandes.  Si expulsa un cogulo de sangre, gurdelo y llame al mdico para informrselo. No deseche los cogulos de sangre por el inodoro antes de recibir indicaciones del mdico.  No use tampones ni se haga duchas vaginales hasta que el mdico la autorice.  Si no est amamantando, volver a tener su perodo entre 6 y 8 semanas despus del parto. Si est amamantando, puede volver a tener su perodo [place] 8 semanas despus del parto y el momento en que deje de Museum/gallery exhibitions officer. Cuidados perineales  Si su cesrea no fue planeada, y pas por el proceso de [place] de parto y puj antes del nacimiento, podra Surveyor, mining, hinchazn y Associate Professor del tejido que se encuentra entre la abertura de la vagina y el ano (perineo). Tambin pueden haberle  hecho una incisin en el tejido (episiotoma) o el tejido puede haberse desgarrado durante el [place]. Siga las siguientes indicaciones como se lo haya indicado su mdico: ? Mantenga el perineo limpio y Personnel officer se lo haya indicado el mdico. Utilice apsitos o aerosoles analgsicos y Control and instrumentation engineer, como se lo hayan  indicado. ? Si le realizaron una episiotoma o un desgarro vaginal, controle la zona [employer] para detectar signos de infeccin. Est atento a los siguientes signos:  Enrojecimiento, Optician, dispensing.  Lquido o sangre.  Calor.  Pus o mal olor. ? Es posible que le den una botella rociadora para que use en lugar de limpiarse el rea con papel higinico despus de usar el bao. Cuando comience a Barrister's clerk, podr usar la botella rociadora antes de secarse. Asegrese de secarse suavemente. ? Para Engineer, materials causado por una episiotoma, un desgarro vaginal o hemorroides, trate de tomar un bao de asiento tibio 2 o 3 veces por da. Un bao de asiento es un bao de agua tibia que se toma mientras se est sentado. El agua solo debe Adult nurse las caderas y cubrir las nalgas.   Cuidado de las [address]  En los [address] despus del parto, las mamas pueden sentirse pesadas, llenas e incmodas (congestin [place]). Tambin puede tener Mirant se escapa de sus senos. El mdico puede sugerirle mtodos para Systems analyst. La congestin mamaria debera desaparecer al cabo de [employer] Mutual [employer] Omaha.  Si est amamantando: ? Use un sostn que sujete y ajuste bien sus pechos. ? Mantenga los pezones secos y limpios. Aplquese cremas y Energy Transfer Partners se lo haya indicado el mdico. ? Es posible que deba usar discos de algodn en el sostn para Insurance account manager Mirant se filtre. ? Puede tener contracciones uterinas cada vez que amamante durante varias semanas despus del [place]. Las contracciones uterinas ayudan al tero a Hotel manager a su tamao habitual. ? Si tiene algn problema con la lactancia materna, consulte con su mdico o con un Holiday representative.  Si no est amamantando: ? Evite tocarse las [address], ya que esto puede hacer que produzcan ms [place]. ? Use un sostn que le proporcione el ajuste correcto y compresas fras para reducir la hinchazn. ? No extraiga (saque) [employer]. Esto har  que produzca ms [employer]. Intimidad y sexualidad  Pregntele al mdico cundo puede retomar la actividad sexual. Esto puede depender de lo siguiente: ? Riesgo de sufrir una infeccin. ? Velocidad de cicatrizacin. ? Comodidad y deseo de [employer] Corporation sexual.  Despus del parto, puede quedar embarazada incluso si no ha tenido todava su perodo. Si lo desea, hable con el mdico acerca de los mtodos de planificacin familiar o control de la natalidad (mtodos anticonceptivos). Estilo de vida  No consuma ningn producto que contenga nicotina o tabaco, como cigarrillos, cigarrillos electrnicos y tabaco de Theatre manager. Si necesita ayuda para dejar de consumir, consulte al mdico.  No beba alcohol, especialmente si est amamantando. Comida y bebida  Beba suficiente lquido como para Pharmacologist la orina de color amarillo plido.  Coma alimentos ricos en [employer]. Estos pueden ayudarla a prevenir o Educational psychologist. Los alimentos ricos en fibra incluyen los siguientes: ? Panes y cereales integrales. ? Arroz integral. ? Armed forces operational officer. ? Nils Pyle y verduras frescas.  Tome sus vitaminas prenatales hasta la visita de control de posparto o hasta que su mdico le indique que puede dejar de tomarlas.   Indicaciones generales  Concurra a todas las  visitas de control para usted y el beb como se lo haya indicado el mdico. La mayora de las mujeres visita al mdico para un control de posparto dentro de las primeras 3 a 6 semanas despus del [place]. Comunquese con un mdico si:  Se siente incapaz de controlar los cambios que implica tener un beb recin nacido y esos sentimientos no desaparecen.  Siente tristeza o preocupacin de forma inusual.  Siente dolor en las mamas, o estn duras o enrojecidas.  Tiene fiebre.  Tiene dificultad para retener la [place] o para impedir que la orina se escape.  Tiene poco inters o falta de inters en actividades que solan gustarle.  No ha  amamantado para nada y no ha tenido un perodo menstrual durante 12 semanas despus del [place].  Dej de amamantar al beb y no ha tenido su perodo menstrual durante 12 semanas despus de dejar de Museum/gallery exhibitions officer.  Tiene preguntas sobre su cuidado o el del beb.  Elimina un cogulo de sangre por la vagina. Solicite ayuda inmediatamente si:  Midwife.  Presenta dificultad para respirar.  Tiene un dolor repentino e intenso en la pierna.  Tiene dolor intenso o clicos en el abdomen.  Tiene un sangrado tan intenso en la vagina que empapa ms de un apsito sanitario en [employer]. El sangrado no debe ser ms abundante que el perodo ms intenso que haya tenido.  Presenta dolor de cabeza intenso.  Se desmaya.  Tiene visin borrosa o Nurse, adult.  Tiene secrecin vaginal con mal olor.  Tiene pensamientos de autolesionarse o de lesionar al beb. Si alguna vez siente que puede lastimarse o Physicist, medical a Economist, o tiene pensamientos de poner fin a su vida, busque ayuda de inmediato. Puede dirigirse al servicio de emergencias ms cercano o comunicarse con:  El servicio de emergencias de su localidad (911 en [place]  Una lnea de asistencia al suicida y Visual merchandiser en crisis, como National Suicide Prevention Lifeline (Lnea Nacional de Prevencin del Suicidio), al 4011705202. Est disponible las 24 horas del da. Resumen  El perodo de tiempo desde el parto y Kristina Sullivan 6 a 12 semanas despus del parto se denomina perodo posparto.  Retome sus actividades normales de a poco segn lo indicado por el mdico.  Concurra a todas las visitas de control para usted y Research scientist (physical sciences) se lo haya indicado el mdico. Esta informacin no tiene Theme park manager el consejo del mdico. Asegrese de hacerle al mdico cualquier pregunta que tenga. Document Revised: 06/06/2018 Document Reviewed: 06/06/2018 Elsevier Patient Education  2021 Elsevier Inc. Consejos sobre la lactancia para  un buen agarre Breastfeeding Tips for a Good Latch El agarre, o que el beb "se prenda", se refiere a cmo la boca del beb se agarra al pezn al [employer]. Es una parte importante de la Market researcher. El beb puede tener problemas para prenderse a la mama por diversos motivos, tales como:  No estar en la posicin correcta.  Usar bibern o Chartered certified accountant.  Problemas en la boca, la lengua o los labios del beb.  La forma de los pezones de la [place].  El beb nace con demasiada anticipacin (prematuramente). Los bebs pequeos suelen succionar con debilidad.  Mamas saturadas de leche (congestin [place]).  Extrigase un poco de [place] para ablandar las [place]. Trabaje con un especialista en lactancia (asesor en lactancia) para ayudar a que el beb se prenda correctamente. Jill Alexanders modo me afecta? Si el beb no se prende bien,  esto puede causarle los siguientes problemas:  Pezones agrietados.  Dolor en los pezones.  Mamas demasiado llenas de [place].  Obstruccin de los International aid/development workerconductos galactforos.  [place] produccin de [place].  Inflamacin de las mamas.  Infeccin mamaria. Cmo afecta esto al beb? Si el beb no se prende bien, esto puede hacer que no pueda alimentarse adecuadamente. Como resultado de Mathewsesto, puede tener problemas para aumentar de [place] Fallspeso. Siga estas instrucciones en su casa: En qu posicin poner al beb  Busque un lugar cmodo para sentarse o acostarse. Es importante que tenga bien apoyados el cuello y la espalda.  Si est sentada, coloque una almohada o una manta enrollada debajo del beb. De este modo, lo acercar al nivel de su mama.  Asegrese de que el vientre de su beb est frente al suyo.  Pruebe diferentes posiciones para hallar la que funcione mejor para usted y el beb. Cmo ayudar a que el beb se prenda  Para empezar, puede que le resulte til frotarse el pecho suavemente. Con las yemas de los dedos, masajee la pared del pecho hacia el pezn en un  movimiento circular. Esto estimula el flujo de la [place]. Si es necesario, contine Sports coachhacindolo mientras amamanta.  Posicione su mama. Sostenga la mama con el pulgar por arriba del pezn y los otros cuatro dedos por debajo de la mama. Mantenga los dedos lejos del pezn y de la boca del beb. Siga estos pasos para ayudar al beb a prenderse: 1. Frote suavemente los labios del beb con el pezn o con el dedo. 2. Cuando la boca del beb se abra lo suficiente, acrquelo rpidamente a la mama e introduzca todo el pezn dentro de la boca del beb. Introduzca en la boca del beb tanto de la zona de color que rodea el pezn (la arola)como sea posible. 3. La lengua del beb debe estar entre la enca inferior y la [place]ama. 4. Debe poder ver ms arola por arriba del labio superior del beb que por debajo del labio inferior. 5. Cuando el beb empiece a succionar, sentir un jaln suave en el pezn. No debera sentir nada de dolor. Tenga paciencia. Es comn que el beb succione durante 2 o 3minutos antes de que comience el flujo de [place] materna. 6. Asegrese de que la boca del beb est posicionada correctamente alrededor del pezn. Los labios del beb deben crear un sello sobre la mama y estar orientados hacia fuera.   Instrucciones generales  Busque estos signos de que el beb se ha prendido bien al pezn: ? El beb tironea o succiona con tranquilidad, sin causarle dolor. ? Oye que el beb traga despus de succionar 3 o 4 veces. ? Ve movimiento por arriba y delante de las orejas del beb mientras succiona.  Preste atencin a estos signos de que el beb no se ha prendido bien al pezn: ? El beb hace sonidos al succionar o chasquidos mientras amamanta. ? Tiene dolor en los pezones.  Si el beb no est bien prendido, introduzca el Micron Technologymeique entre las encas del beb y el pezn. De este modo romper el sello. Luego, intente ayudar al beb a prenderse otra vez.  Si necesita ayuda, vea a un especialista en  lactancia. Comunquese con un mdico si:  Usted presenta agrietamiento o irritacin en los pezones durante ms de 1semana.  Tiene dolor en los pezones.  Tiene demasiada State Farmleche en las mamas (congestin [place]ymamaria) y no mejora despus de un lapso de 48 a 72 horas.  Tiene una obstruccin  en un conducto galactforo y [place].  Sigue los consejos para lograr un buen agarre, pero necesita ms ayuda.  Observa que sale un lquido similar al pus por la mama.  Su beb no aumenta de peso.  Su beb pierde peso. Resumen  El agarre, o que el beb "se prenda", se refiere a cmo la boca del beb se agarra al pezn al [employer].  Pruebe posiciones diferentes para Copy una que funcione tanto para usted como para el beb.  Si el beb no se prende bien, a usted se Associate Professor o [employer], o puede tener otros [place].  Trabaje con un especialista en lactancia (asesor en lactancia) para ayudar a que el beb se prenda correctamente. Esta informacin no tiene Theme park manager el consejo del mdico. Asegrese de hacerle al mdico cualquier pregunta que tenga. Document Revised: 04/08/2020 Document Reviewed: 04/08/2020 Elsevier Patient Education  2021 [employer].

## 2020-12-20 NOTE — Discharge Summary (Signed)
Physician Obstetric Discharge Summary  Patient Name: Kristina Sullivan DOB: December 02, 1980 MRN: [id number]                            Discharge Summary  Date of Admission: 12/19/2020 Date of Discharge: 12/20/2020 Delivering Provider: Linzie Collin   Admitting Diagnosis: Normal labor [O80, Z37.9] at [redacted]w[redacted]d Secondary diagnosis:  Active Problems:   Normal labor  Previous CD - desires repeat   Mode of Delivery:       low uterine, transverse  (midline skin)     Discharge diagnosis: Term Pregnancy Delivered       Complications: none                     Discharge Day SOAP Note:  Subjective:  The patient has no complaints.  She is ambulating well. She is taking PO well. Pain is well controlled with current medications. Taking rare pain meds. Patient is urinating without difficulty.   She is passing flatus.  She strongly desires discharge.  Objective  Vital signs: BP 100/71 (BP Location: Left Arm)   Pulse 76   Temp 98.2 F (36.8 C) (Oral)   Resp 18   Ht 5\' 3"  (1.6 m)   Wt 97.1 kg   SpO2 99%   Breastfeeding Unknown   BMI 37.91 kg/m   Physical Exam: Gen: NAD Abdomen:   Soft, dressing intact. Some drainage noted under.  Fundus Fundal Tone: Firm  Lochia Amount: Small     Data Review Labs: Lab Results  Component Value Date   WBC 15.0 (H) 12/19/2020   HGB 12.9 12/19/2020   HCT 39.5 12/19/2020   MCV 84.8 12/19/2020   PLT 353 12/19/2020   CBC Latest Ref Rng & Units 12/19/2020  WBC 4.0 - 10.5 K/uL 15.0(H)  Hemoglobin 12.0 - 15.0 g/dL 02/18/2021  Hematocrit 14.4 - 46.0 % 39.5  Platelets 150 - 400 K/uL 353   O POS Performed at [facility], [address] [place], [place]   [address] Score: Inocente Salles Postnatal Depression Scale Screening Tool 12/19/2020  I have been able to laugh and see the funny side of things. 0  I have looked forward with enjoyment to things. 0  I have blamed myself unnecessarily when things went wrong. 0  I have been  anxious or worried for no good reason. 0  I have felt scared or panicky for no good reason. 0  Things have been getting on top of me. 0  I have been so unhappy that I have had difficulty sleeping. 0  I have felt sad or miserable. 0  I have been so unhappy that I have been crying. 0  The thought of harming myself has occurred to me. 0  Edinburgh Postnatal Depression Scale Total 0    Assessment:  Active Problems:   Normal labor   Doing well.  Normal progress as expected. Pt desires discharge Plan:  Discharge to home  Modified rest as directed - may slowly resume normal activities with restrictions  as discussed.  Medications as written.  See below for additional.      Discharge Instructions: Per After Visit Summary. Activity: Advance as tolerated. Pelvic rest for 6 weeks.  Also refer to After Visit Summary.  Wound care discussed. Diet: Regular Medications:  Outpatient follow up:   Follow-up Information    02/18/2021, MD Follow up in 1 week(s).   Specialties: Obstetrics and  Gynecology, Radiology Contact information: [address] [place] [address] [phone]              Postpartum contraception: Will discuss at first post-partum visit.  Discharged Condition: good  Discharged to: home  Newborn Data: Disposition:home with mother  Apgars: APGAR (1 MIN): 9   APGAR (5 MINS): 9   APGAR (10 MINS):    Baby Feeding: Breast  Elonda Husky, M.D. 12/20/2020 10:32 AM

## 2020-12-20 NOTE — Lactation Note (Signed)
This note was copied from a baby's chart. Lactation Consultation Note  Patient Name: Kristina Sullivan [id number] Date: 12/20/2020 Reason for consult: Initial assessment;Early term 37-38.6wks Age:38 hours  Maternal Data Has patient been taught Hand Expression?: Yes  Lactation to the room for initial visit. Mother is holding the baby and latched in reclined cradle on the left. Baby is feeding well with a rhythmic sucking pattern and swallows noted.  Encouraged feeding on demand and with cues. If baby is not cueing encouraged hand expression and skin to skin. Taught proper technique for hand expression. Encouraged 8 or more attempts in the first 24 hours and 8 or more good feeds after 24 HOL. Reviewed appropriate diapers for days of life and How to know your baby is getting enough to eat. Reviewed "Understanding Postpartum and Newborn Care" booklet at bedside. [facility] # left on board, encouraged to call for any assistance. Mother has no further questions at this time.     Xin Klawitter D Liticia Gasior 12/19/20 1655

## 2020-12-20 NOTE — Lactation Note (Signed)
This note was copied from a baby's chart. Lactation Consultation Note  Patient Name: Kristina Sullivan [id number] Date: 12/20/2020 Reason for consult: Early term 37-38.6wks;Follow-up assessment Age:38 hours  Infant held with FOB upon entry.   Infant swaddled, clothed, cueing.    Lactating patient concerned about supply.  Infant had been cluster feeding.  [facility] student suggested unwrapping infant for feeding.  Lactating patient brought infant to left breast in cradle position.  LC student adjusted position to football.  Infant latched and audible swallows were heard.    LC student educated on supply and demand, benefits of colostrum, hand expressing, infant belly size, cluster feeding, breast changes, transitional milk, stool transition.    Patient doesn't have access to a pump at this time.  [facility] student provided patient with a breast pump for later use.     Patient denies any additional questions at this time.   Plan 1. Continue to feed 8+ times within 24 hrs 2. Hand express prior to latching to assist with obtaining a deeper latch 2. Breast compressions and massage during feedings    Maternal Data Has patient been taught Hand Expression?: Yes  Feeding Mother's Current Feeding Choice: Breast Milk  LATCH Score Latch: Repeated attempts needed to sustain latch, nipple held in mouth throughout feeding, stimulation needed to elicit sucking reflex.  Audible Swallowing: Spontaneous and intermittent  Type of Nipple: Everted at rest and after stimulation  Comfort (Breast/Nipple): Soft / non-tender  Hold (Positioning): Assistance needed to correctly position infant at breast and maintain latch.  LATCH Score: 8   Lactation Tools Discussed/Used    Interventions Interventions: Breast feeding basics reviewed;Assisted with latch;Breast massage;Hand express;Breast compression;Adjust position;Support pillows;Hand pump;Education  Discharge    Consult Status Consult Status:  Follow-up    Kristina Sullivan 12/20/2020, 4:52 PM

## 2020-12-20 NOTE — Progress Notes (Signed)
Pt discharged.  Discharge instructions, prescriptions and follow up appointment given to and reviewed with pt. Pt verbalized understanding. Patient staying in 77, infant now a pediatric patient.

## 2020-12-20 NOTE — Progress Notes (Signed)
Alert and oriented with appropriate affect. Color good, skin w&d. Assessment and VS WNL. Pt. Care Plan and Education completed by K. Jeanella Craze Charity fundraiser. Pt. Denies questions and states she understands F/U appointment and when she is to take her medications. Pt.'s Infant is not being discharged; therefor, Pt. And Sig. Other are staying with Newborn.

## 2020-12-21 ENCOUNTER — Ambulatory Visit: Payer: Self-pay

## 2020-12-21 LAB — RUBELLA SCREEN: Rubella: 7.82 index (ref >0.99)

## 2020-12-21 NOTE — Lactation Note (Signed)
This note was copied from a baby's chart. Lactation Consultation Note  Patient Name: Kristina Sullivan [id number] Date: 12/21/2020 Reason for consult: Follow-up assessment;Early term 37-38.6wks Age:38 hours   LC Student arrived in the room where mother, father, and infant are present. Infant was crying hysterically and mother was encouraged to try and feed. Infant immediately latched. Lips were flanged and audible sucks and swallows were present. [facility] Student assisted mother with positioning. LC Student provided education about deep vs shallow latch, breast changes, feeding patterns and cues, and correct positioning. Mother stated she understood and did not have any further questions. Infant ws still latched at the breast upon departure.   Maternal Data Has patient been taught Hand Expression?: Yes Does the patient have breastfeeding experience prior to this delivery?: No  Feeding Mother's Current Feeding Choice: Breast Milk  LATCH Score Latch: Grasps breast easily, tongue down, lips flanged, rhythmical sucking.  Audible Swallowing: Spontaneous and intermittent  Type of Nipple: Everted at rest and after stimulation  Comfort (Breast/Nipple): Soft / non-tender  Hold (Positioning): Assistance needed to correctly position infant at breast and maintain latch.  LATCH Score: 9     Interventions Interventions: Adjust position;Support pillows;Breast feeding basics reviewed;Education  Discharge    Consult Status Consult Status: Follow-up Date: 12/22/20    [name] 12/21/2020, 1:11 PM

## 2020-12-22 ENCOUNTER — Encounter: Payer: Self-pay | Admitting: Obstetrics and Gynecology

## 2020-12-29 ENCOUNTER — Ambulatory Visit (INDEPENDENT_AMBULATORY_CARE_PROVIDER_SITE_OTHER): Payer: No Typology Code available for payment source | Admitting: Obstetrics and Gynecology

## 2020-12-29 ENCOUNTER — Encounter: Payer: Self-pay | Admitting: Obstetrics and Gynecology

## 2020-12-29 ENCOUNTER — Other Ambulatory Visit: Payer: Self-pay

## 2020-12-29 VITALS — BP 123/81 | HR 70 | Ht 63.0 in | Wt 203.7 lb

## 2020-12-29 DIAGNOSIS — Z9889 Other specified postprocedural states: Secondary | ICD-10-CM

## 2020-12-29 NOTE — Progress Notes (Signed)
HPI:      [name] is a 40 y.o. [phone] who LMP was No LMP recorded.  Subjective:   She presents today 10 days from cesarean delivery.  She reports she is doing well.  Baby is doing well.  She is bottlefeeding.  She has remove the honeycomb dressing because she says water got in it with her shower.  She has no complaints.  Her pain is controlled.    Hx: The following portions of the patient's history were reviewed and updated as appropriate:             She  has a past medical history of Diabetes mellitus without complication (HCC). She does not have any pertinent problems on file. She  has a past surgical history that includes Cesarean section (12/19/2020). Her family history is not on file. She  reports that she has never smoked. She has never used smokeless tobacco. She reports previous alcohol use. She reports previous drug use. She has a current medication list which includes the following prescription(s): hydrocodone-acetaminophen, ibuprofen, and prenatal multivitamin. She has No Known Allergies.       Review of Systems:  Review of Systems  Constitutional: Denied constitutional symptoms, night sweats, recent illness, fatigue, fever, insomnia and weight loss.  Eyes: Denied eye symptoms, eye pain, photophobia, vision change and visual disturbance.  Ears/Nose/Throat/Neck: Denied ear, nose, throat or neck symptoms, hearing loss, nasal discharge, sinus congestion and sore throat.  Cardiovascular: Denied cardiovascular symptoms, arrhythmia, chest pain/pressure, edema, exercise intolerance, orthopnea and palpitations.  Respiratory: Denied pulmonary symptoms, asthma, pleuritic pain, productive sputum, cough, dyspnea and wheezing.  Gastrointestinal: Denied, gastro-esophageal reflux, melena, nausea and vomiting.  Genitourinary: Denied genitourinary symptoms including symptomatic vaginal discharge, pelvic relaxation issues, and urinary complaints.  Musculoskeletal: Denied  musculoskeletal symptoms, stiffness, swelling, muscle weakness and myalgia.  Dermatologic: Denied dermatology symptoms, rash and scar.  Neurologic: Denied neurology symptoms, dizziness, headache, neck pain and syncope.  Psychiatric: Denied psychiatric symptoms, anxiety and depression.  Endocrine: Denied endocrine symptoms including hot flashes and night sweats.   Meds:   Current Outpatient Medications on File Prior to Visit  Medication Sig Dispense Refill  . HYDROcodone-acetaminophen (NORCO/VICODIN) 5-325 MG tablet Take 1-2 tablets by mouth every 6 (six) hours as needed for moderate pain. 15 tablet 0  . ibuprofen (ADVIL) 800 MG tablet Take 1 tablet (800 mg total) by mouth every 8 (eight) hours. 30 tablet 0  . Prenatal Vit-Fe Fumarate-FA (PRENATAL MULTIVITAMIN) TABS tablet Take 1 tablet by mouth daily at 12 noon.     No current facility-administered medications on file prior to visit.          Objective:     Vitals:   12/29/20 1048  BP: 123/81  Pulse: 70   Filed Weights   12/29/20 1048  Weight: 203 lb 11.2 oz (92.4 kg)               Abdomen: Soft.  Non-tender.  No masses.  No HSM.  Incision/s: Intact.  Healing well.  No erythema.  No drainage.   Staples removed-benzoin Steri-Strips placed.  Sutures allowed to remain, instructed patient and husband how to remove in 1 week.   Assessment:    [id number] Patient Active Problem List   Diagnosis Date Noted  . Normal labor 12/19/2020     1. Post-operative state     Patient doing very well postop.   Plan:            1.  Follow-up in 4  weeks. Orders No orders of the defined types were placed in this encounter.   No orders of the defined types were placed in this encounter.     F/U  Return in about 4 weeks (around 01/26/2021).  Elonda Husky, M.D. 12/29/2020 11:09 AM

## 2021-01-26 ENCOUNTER — Ambulatory Visit (INDEPENDENT_AMBULATORY_CARE_PROVIDER_SITE_OTHER): Payer: No Typology Code available for payment source | Admitting: Obstetrics and Gynecology

## 2021-01-26 ENCOUNTER — Other Ambulatory Visit: Payer: Self-pay

## 2021-01-26 ENCOUNTER — Encounter: Payer: Self-pay | Admitting: Obstetrics and Gynecology

## 2021-01-26 LAB — POCT URINE PREGNANCY: Preg Test, Ur: NEGATIVE

## 2021-01-26 NOTE — Progress Notes (Signed)
HPI:      [name] is a 40 y.o. [phone] who LMP was No LMP recorded.  Subjective:   She presents today approximately 6 weeks from cesarean delivery.  She denies problems.  She has not resumed intercourse.  She is eating voiding and having bowel movements without issue. She desires Nexplanon for birth Kristina Sullivan has had it before. She is currently bottlefeeding.    Hx: The following portions of the patient's history were reviewed and updated as appropriate:             She  has a past medical history of Diabetes mellitus without complication (HCC). She does not have any pertinent problems on file. She  has a past surgical history that includes Cesarean section (12/19/2020). Her family history is not on file. She  reports that she has never smoked. She has never used smokeless tobacco. She reports previous alcohol use. She reports previous drug use. She has a current medication list which includes the following prescription(s): hydrocodone-acetaminophen, ibuprofen, and prenatal multivitamin. She has No Known Allergies.       Review of Systems:  Review of Systems  Constitutional: Denied constitutional symptoms, night sweats, recent illness, fatigue, fever, insomnia and weight loss.  Eyes: Denied eye symptoms, eye pain, photophobia, vision change and visual disturbance.  Ears/Nose/Throat/Neck: Denied ear, nose, throat or neck symptoms, hearing loss, nasal discharge, sinus congestion and sore throat.  Cardiovascular: Denied cardiovascular symptoms, arrhythmia, chest pain/pressure, edema, exercise intolerance, orthopnea and palpitations.  Respiratory: Denied pulmonary symptoms, asthma, pleuritic pain, productive sputum, cough, dyspnea and wheezing.  Gastrointestinal: Denied, gastro-esophageal reflux, melena, nausea and vomiting.  Genitourinary: Denied genitourinary symptoms including symptomatic vaginal discharge, pelvic relaxation issues, and urinary complaints.   Musculoskeletal: Denied musculoskeletal symptoms, stiffness, swelling, muscle weakness and myalgia.  Dermatologic: Denied dermatology symptoms, rash and scar.  Neurologic: Denied neurology symptoms, dizziness, headache, neck pain and syncope.  Psychiatric: Denied psychiatric symptoms, anxiety and depression.  Endocrine: Denied endocrine symptoms including hot flashes and night sweats.   Meds:   Current Outpatient Medications on File Prior to Visit  Medication Sig Dispense Refill  . HYDROcodone-acetaminophen (NORCO/VICODIN) 5-325 MG tablet Take 1-2 tablets by mouth every 6 (six) hours as needed for moderate pain. (Patient not taking: Reported on 01/26/2021) 15 tablet 0  . ibuprofen (ADVIL) 800 MG tablet Take 1 tablet (800 mg total) by mouth every 8 (eight) hours. (Patient not taking: Reported on 01/26/2021) 30 tablet 0  . Prenatal Vit-Fe Fumarate-FA (PRENATAL MULTIVITAMIN) TABS tablet Take 1 tablet by mouth daily at 12 noon. (Patient not taking: Reported on 01/26/2021)     No current facility-administered medications on file prior to visit.       The pregnancy intention screening data noted above was reviewed. Potential methods of contraception were discussed. The patient elected to proceed with Hormonal Implant.     Objective:     Vitals:   01/26/21 0948  BP: (!) 130/96  Pulse: (!) 101   Filed Weights   01/26/21 0948  Weight: 196 lb 14.4 oz (89.3 kg)     Abdomen: Soft.  Non-tender.  No masses.  No HSM.  Incision/s: Intact.  Healing well.  No erythema.  No drainage.              Pelvic examination   Pelvic:   Vulva: Normal appearance.  No lesions.  No abnormal scarring.    Vagina: No lesions or abnormalities noted.  Support: Normal pelvic support.  Urethra No masses tenderness or  scarring.  Meatus Normal size without lesions or prolapse.  Cervix: Normal ectropion.  No lesions.  Anus: Normal exam.  No lesions.  Perineum: Normal exam.  No lesions.  Healed well.           Bimanual   Uterus: Normal size.  Non-tender.  Mobile.  AV.  Adnexae: No masses.  Non-tender to palpation.  Cul-de-sac: Negative for abnormality.     Assessment:    [id number] Patient Active Problem List   Diagnosis Date Noted  . Normal labor 12/19/2020     1. Postpartum care following cesarean delivery   2. Postpartum care and examination immediately after delivery     Patient doing very well postpartum   Plan:            1.  May resume normal activities with exception of heavy lifting.  2.  If resumption of intercourse patient advised to use birth control until Nexplanon is placed.  3.  Dr. Valentino Saxon to place Nexplanon-discussed with her. Orders Orders Placed This Encounter  Procedures  . POCT urine pregnancy    No orders of the defined types were placed in this encounter.     F/U  Return in about 1 week (around 02/02/2021) for [facility].  Elonda Husky, M.D. 01/26/2021 10:07 AM

## 2021-02-04 ENCOUNTER — Encounter: Payer: Self-pay | Admitting: Obstetrics and Gynecology

## 2021-02-04 ENCOUNTER — Ambulatory Visit (INDEPENDENT_AMBULATORY_CARE_PROVIDER_SITE_OTHER): Payer: No Typology Code available for payment source | Admitting: Obstetrics and Gynecology

## 2021-02-04 ENCOUNTER — Other Ambulatory Visit: Payer: Self-pay

## 2021-02-04 VITALS — BP 122/82 | HR 65 | Ht 63.0 in | Wt 199.4 lb

## 2021-02-04 DIAGNOSIS — Z30017 Encounter for initial prescription of implantable subdermal contraceptive: Secondary | ICD-10-CM

## 2021-02-04 LAB — POCT URINE PREGNANCY: Preg Test, Ur: NEGATIVE

## 2021-02-04 NOTE — Progress Notes (Signed)
Pt present for nexplanon insertion. Pt stated that she was doing well. Pt currently not breastfeeding nor had sexually intercourse since the birth of the baby. UPT-NEG.

## 2021-02-04 NOTE — Progress Notes (Signed)
    GYNECOLOGY OFFICE PROCEDURE NOTE  Kristina Sullivan is a 40 y.o. [id number] here for Nexplanon insertion.  Last pap smear was on ~ 2 years ago and was normal.  No other gynecologic concerns.  Nexplanon Insertion Procedure Patient identified, informed consent performed, consent signed.   Patient does understand that irregular bleeding is a very common side effect of this medication. She was advised to have backup contraception for one week after placement. Pregnancy test in clinic today was negative.  Appropriate time out taken.  Patient's left arm was prepped and draped in the usual sterile fashion. The ruler used to measure and mark insertion area.  Patient was prepped with alcohol swab and then injected with 3 ml of 1% lidocaine.  She was prepped with betadine, Nexplanon removed from packaging,  Device confirmed in needle, then inserted full length of needle and withdrawn per handbook instructions. Nexplanon was able to palpated in the patient's arm; patient palpated the insert herself. There was minimal blood loss.  Patient insertion site covered with guaze and a pressure bandage to reduce any bruising.  The patient tolerated the procedure well and was given post procedure instructions.    Lot: [id number] Exp: Jan 29, 2023   Hildred Laser, MD [facility]

## 2021-02-04 NOTE — Patient Instructions (Addendum)
NEXPLANON PLACEMENT POST-PROCEDURE INSTRUCTIONS ° °1. You may take Ibuprofen, Aleve or Tylenol for pain if needed.  Pain should resolve within in 24 hours. ° °2. You may have intercourse after 24 hours.  If you using this for birth control, it is effective immediately. ° °3. You need to call if you have any fever, heavy bleeding, or redness at insertion site. Irregular bleeding is common the first several months after having a Nexplanonplaced. You do not need to call for this reason unless you are concerned. ° °4. Shower or bathe as normal.  You can remove the bandage after 24 hours. ° °

## 2021-06-09 ENCOUNTER — Encounter: Payer: No Typology Code available for payment source | Admitting: Obstetrics and Gynecology

## 2022-12-01 ENCOUNTER — Ambulatory Visit: Payer: BC Managed Care – PPO | Admitting: Advanced Practice Midwife

## 2022-12-01 VITALS — BP 121/74 | HR 67 | Ht 63.0 in | Wt 180.0 lb

## 2022-12-01 DIAGNOSIS — Z308 Encounter for other contraceptive management: Secondary | ICD-10-CM

## 2022-12-01 DIAGNOSIS — Z3046 Encounter for surveillance of implantable subdermal contraceptive: Secondary | ICD-10-CM | POA: Diagnosis not present

## 2022-12-01 DIAGNOSIS — Z3009 Encounter for other general counseling and advice on contraception: Secondary | ICD-10-CM

## 2022-12-01 DIAGNOSIS — E669 Obesity, unspecified: Secondary | ICD-10-CM | POA: Insufficient documentation

## 2022-12-01 LAB — WET PREP FOR TRICH, YEAST, CLUE
Trichomonas Exam: NEGATIVE
Yeast Exam: NEGATIVE

## 2022-12-01 LAB — HM HIV SCREENING LAB: HM HIV Screening: NEGATIVE

## 2022-12-01 LAB — HEMOGLOBIN, FINGERSTICK: Hemoglobin: 12.9 g/dL (ref 11.1–15.9)

## 2022-12-01 NOTE — Progress Notes (Signed)
Pt is here for PE, Pap and STD screening.  Wet mount results reviewed, no treatment required per SO.  Pt notified of normal Hgb results.  FP packet given.  Windle Guard, RN

## 2022-12-01 NOTE — Progress Notes (Signed)
[facility] Main Number: [phone]    Family Planning Visit- Initial Visit  Subjective:  Kristina Sullivan is a 42 y.o. MHF exsmoker [id number] (23, 31, 2)  being seen today for an initial annual visit and to discuss reproductive life planning.  The patient is currently using Hormonal Implant for pregnancy prevention. Patient reports she/her/hers  does not want a pregnancy in the next year.    she/her/hers report they are looking for a method that provides High efficacy at preventing pregnancy  Patient has the following medical conditions has Normal labor on their problem list.  Chief Complaint  Patient presents with   Contraception    Physical and pap has a spot on her vagina that is concerning her     Patient reports here for physical, pap. Last PE 01/26/21. Nexplanon inserted 01/26/21 and happy with it. LMP  11/25/22. Last pap 2021 wnl per pt. Last cig age 89. Last ETOH 11/27/22 (1 Margarita) 1x/mo. Last sex 10/27/22 without condom; with current partner x 10 years; 1 partner in last 3 mo. Last dental exam 2020. Working 30 hrs/wk and living with husband and 56 yo. C/o "bump" inside vagina on left at introitus x 3 wks nontender.  Patient denies vaping, cigars, MJ  Body mass index is 31.89 kg/m. - Patient is eligible for diabetes screening based on BMI> 25 and age >35?  no HA1C ordered? no  Patient reports 1  partner/s in last year. Desires STI screening?  Yes  Has patient been screened once for HCV in the past?  No  No results found for: "HCVAB"  Does the patient have current drug use (including MJ), have a partner with drug use, and/or has been incarcerated since last result? No  If yes-- Screen for HCV through [facility]   Does the patient meet criteria for HBV testing? No  Criteria:  -Household, sexual or needle sharing contact with HBV -History of drug use -HIV positive -Those with known Hep  C   Health Maintenance Due  Topic Date Due   COVID-19 Vaccine (1) Never done   Hepatitis C Screening  Never done   DTaP/Tdap/Td (1 - Tdap) Never done   PAP SMEAR-Modifier  Never done   INFLUENZA VACCINE  04/19/2022    Review of Systems  All other systems reviewed and are negative.   The following portions of the patient's history were reviewed and updated as appropriate: allergies, current medications, past family history, past medical history, past social history, past surgical history and problem list. Problem list updated.   See flowsheet for other program required questions.  Objective:   Vitals:   12/01/22 1342  BP: 121/74  Pulse: 67  Weight: 180 lb (81.6 kg)  Height: '5\' 3"'$  (1.6 m)    Physical Exam Constitutional:      Appearance: Normal appearance. She is obese.  HENT:     Head: Normocephalic and atraumatic.     Mouth/Throat:     Mouth: Mucous membranes are moist.     Comments: Fair dentition;last dental exam 2020 Eyes:     Conjunctiva/sclera: Conjunctivae normal.  Neck:     Thyroid: No thyroid mass, thyromegaly or thyroid tenderness.  Cardiovascular:     Rate and Rhythm: Normal rate and regular rhythm.  Pulmonary:     Effort: Pulmonary effort is normal.     Breath sounds: Normal breath sounds.  Chest:  Breasts:    Right: Normal.  Left: Normal.  Abdominal:     Palpations: Abdomen is soft.     Comments: Soft without masses or tenderness, fair tone  Genitourinary:    General: Normal vulva.     Exam position: Lithotomy position.     Vagina: Vaginal discharge (light brown menses leukorrhea, ph<4.5) present.     Cervix: Normal.     Uterus: Normal.      Adnexa: Right adnexa normal and left adnexa normal.     Rectum: Normal.       Comments: Left posterior introitus with 1.5 cm firm cyst nontender onset 3 wks ago; counseled to soak in warm bath BID x 20 min  Pap done Musculoskeletal:        General: Normal range of motion.     Cervical back:  Normal range of motion and neck supple.  Skin:    General: Skin is warm and dry.  Neurological:     Mental Status: She is alert.  Psychiatric:        Mood and Affect: Mood normal.       Assessment and Plan:  Koryna Luebbers is a 42 y.o. female presenting to the [facility] Department for an initial annual wellness/contraceptive visit  Contraception counseling: Reviewed options based on patient desire and reproductive life plan. Patient is interested in Hormonal Implant. This was provided to the patient today.  if not why not clearly documented  Risks, benefits, and typical effectiveness rates were reviewed.  Questions were answered.  Written information was also given to the patient to review.    The patient will follow up in  1 years for surveillance.  The patient was told to call with any further questions, or with any concerns about this method of contraception.  Emphasized use of condoms 100% of the time for STI prevention.  Need for ECP was assessed. Patient reported not meeting criteria.  Reviewed options and patient desired No method of ECP, declined all    1. Family planning Please give pt dental list Treat wet mount per standing orders Immunization nurse consult Pt counseled to begin yearly mammograms at age 62  - [place], YEAST, CLUE - Hemoglobin, venipuncture - Chlamydia/Gonorrhea [facility] - Syphilis Serology, [facility] - IGP, Aptima HPV  2. Encounter for surveillance of implantable subdermal contraceptive Nexplanon inserted 01/26/2021     No follow-ups on file.  No future appointments.  Herbie Saxon, CNM

## 2022-12-07 LAB — IGP, APTIMA HPV
HPV Aptima: NEGATIVE
PAP Smear Comment: 0
